# Patient Record
Sex: Male | Born: 1990 | Race: White | Hispanic: No | Marital: Single | Smoking: Never smoker
Health system: Southern US, Community
[De-identification: ages and names within clinical notes are randomized; demographics above are authoritative.]

## PROBLEM LIST (undated history)

## (undated) DIAGNOSIS — F329 Major depressive disorder, single episode, unspecified: Secondary | ICD-10-CM

## (undated) DIAGNOSIS — N200 Calculus of kidney: Secondary | ICD-10-CM

## (undated) DIAGNOSIS — B019 Varicella without complication: Secondary | ICD-10-CM

## (undated) DIAGNOSIS — F32A Depression, unspecified: Secondary | ICD-10-CM

## (undated) HISTORY — DX: Major depressive disorder, single episode, unspecified: F32.9

## (undated) HISTORY — DX: Depression, unspecified: F32.A

## (undated) HISTORY — DX: Varicella without complication: B01.9

---

## 2016-01-21 ENCOUNTER — Encounter (HOSPITAL_COMMUNITY): Payer: Self-pay | Admitting: Emergency Medicine

## 2016-01-21 DIAGNOSIS — N201 Calculus of ureter: Secondary | ICD-10-CM | POA: Insufficient documentation

## 2016-01-21 DIAGNOSIS — R109 Unspecified abdominal pain: Secondary | ICD-10-CM | POA: Diagnosis present

## 2016-01-21 DIAGNOSIS — Z87442 Personal history of urinary calculi: Secondary | ICD-10-CM | POA: Insufficient documentation

## 2016-01-21 LAB — COMPREHENSIVE METABOLIC PANEL
ALBUMIN: 4.1 g/dL (ref 3.5–5.0)
ALT: 47 U/L (ref 17–63)
AST: 28 U/L (ref 15–41)
Alkaline Phosphatase: 57 U/L (ref 38–126)
Anion gap: 11 (ref 5–15)
BILIRUBIN TOTAL: 0.7 mg/dL (ref 0.3–1.2)
BUN: 11 mg/dL (ref 6–20)
CHLORIDE: 105 mmol/L (ref 101–111)
CO2: 26 mmol/L (ref 22–32)
CREATININE: 0.99 mg/dL (ref 0.61–1.24)
Calcium: 9.5 mg/dL (ref 8.9–10.3)
GFR calc Af Amer: >60 mL/min (ref >60)
GLUCOSE: 154 mg/dL — AB (ref 65–99)
POTASSIUM: 3.7 mmol/L (ref 3.5–5.1)
Sodium: 142 mmol/L (ref 135–145)
TOTAL PROTEIN: 7.5 g/dL (ref 6.5–8.1)

## 2016-01-21 LAB — LIPASE, BLOOD: Lipase: 22 U/L (ref 11–51)

## 2016-01-21 LAB — CBC
HEMATOCRIT: 43.5 % (ref 39.0–52.0)
Hemoglobin: 14.7 g/dL (ref 13.0–17.0)
MCH: 28.5 pg (ref 26.0–34.0)
MCHC: 33.8 g/dL (ref 30.0–36.0)
MCV: 84.5 fL (ref 78.0–100.0)
PLATELETS: 294 10*3/uL (ref 150–400)
RBC: 5.15 MIL/uL (ref 4.22–5.81)
RDW: 12.5 % (ref 11.5–15.5)
WBC: 15.5 10*3/uL — AB (ref 4.0–10.5)

## 2016-01-21 MED ORDER — ONDANSETRON 4 MG PO TBDP
4.0000 mg | ORAL_TABLET | Freq: Once | ORAL | Status: AC | PRN
  Administered 2016-01-21: 4 mg via ORAL

## 2016-01-21 MED ORDER — OXYCODONE-ACETAMINOPHEN 5-325 MG PO TABS
ORAL_TABLET | ORAL | Status: AC
  Administered 2016-01-21: 1 via ORAL
  Filled 2016-01-21: qty 1

## 2016-01-21 MED ORDER — ONDANSETRON 4 MG PO TBDP
ORAL_TABLET | ORAL | Status: AC
  Administered 2016-01-21: 4 mg via ORAL
  Filled 2016-01-21: qty 1

## 2016-01-21 MED ORDER — OXYCODONE-ACETAMINOPHEN 5-325 MG PO TABS
1.0000 | ORAL_TABLET | Freq: Once | ORAL | Status: AC
  Administered 2016-01-21: 1 via ORAL

## 2016-01-21 NOTE — ED Notes (Signed)
C/o lower abd pain all day that is worse the last 2 hours.  Also c/o bilateral testicle pain, bilateral flank pain (R worse than L), nausea, and vomiting.  Reports dysuria and dark colored urine.  History of kidney stone.

## 2016-01-22 ENCOUNTER — Emergency Department (HOSPITAL_COMMUNITY): Payer: BLUE CROSS/BLUE SHIELD

## 2016-01-22 ENCOUNTER — Emergency Department (HOSPITAL_COMMUNITY)
Admission: EM | Admit: 2016-01-22 | Discharge: 2016-01-22 | Disposition: A | Discharge status: Home / Self Care | Payer: BLUE CROSS/BLUE SHIELD | Attending: Emergency Medicine | Admitting: Emergency Medicine

## 2016-01-22 ENCOUNTER — Encounter (HOSPITAL_COMMUNITY): Payer: Self-pay | Admitting: Radiology

## 2016-01-22 DIAGNOSIS — R10A0 Flank pain, unspecified side: Secondary | ICD-10-CM

## 2016-01-22 DIAGNOSIS — R109 Unspecified abdominal pain: Secondary | ICD-10-CM

## 2016-01-22 DIAGNOSIS — N201 Calculus of ureter: Secondary | ICD-10-CM

## 2016-01-22 HISTORY — DX: Calculus of kidney: N20.0

## 2016-01-22 LAB — URINE MICROSCOPIC-ADD ON: WBC, UA: NONE SEEN WBC/hpf (ref 0–5)

## 2016-01-22 LAB — URINALYSIS, ROUTINE W REFLEX MICROSCOPIC
GLUCOSE, UA: NEGATIVE mg/dL
KETONES UR: 15 mg/dL — AB
Nitrite: POSITIVE — AB
PH: 5.5 (ref 5.0–8.0)
PROTEIN: 100 mg/dL — AB
Specific Gravity, Urine: 1.03 (ref 1.005–1.030)

## 2016-01-22 MED ORDER — TAMSULOSIN HCL 0.4 MG PO CAPS: 0.4000 mg | ORAL_CAPSULE | Freq: Every day | ORAL | Status: DC

## 2016-01-22 MED ORDER — ONDANSETRON HCL 4 MG/2ML IJ SOLN
4.0000 mg | Freq: Once | INTRAMUSCULAR | Status: AC
  Administered 2016-01-22: 4 mg via INTRAVENOUS
  Filled 2016-01-22: qty 2

## 2016-01-22 MED ORDER — OXYCODONE-ACETAMINOPHEN 5-325 MG PO TABS: 1.0000 | ORAL_TABLET | Freq: Three times a day (TID) | ORAL | Status: DC | PRN

## 2016-01-22 MED ORDER — HYDROMORPHONE HCL 1 MG/ML IJ SOLN
1.0000 mg | Freq: Once | INTRAMUSCULAR | Status: AC
  Administered 2016-01-22: 1 mg via INTRAVENOUS
  Filled 2016-01-22: qty 1

## 2016-01-22 NOTE — ED Provider Notes (Signed)
CSN: [id number]     Arrival date & time 01/21/16  2229 History   First MD Initiated Contact with Patient 01/22/16 0539     Chief Complaint  Patient presents with  . Abdominal Pain  . Flank Pain    Patient is a 25 y.o. male presenting with abdominal pain and flank pain. The history is provided by the patient.  Abdominal Pain Pain location:  R flank Pain quality: sharp   Pain radiates to:  RLQ Pain severity:  Severe Onset quality:  Gradual Timing:  Intermittent Progression:  Resolved Chronicity:  New Relieved by: pain meds. Worsened by:  Nothing tried Associated symptoms: dysuria, nausea and vomiting   Associated symptoms: no chest pain, no cough and no fever   Flank Pain Associated symptoms include abdominal pain. Pertinent negatives include no chest pain.   Pt with history of previous kidney stones presents for onset of right flank pain that radiates into RLQ He reports this feels similar to prior episodes of stones He has had lithotripsy before at an outside hospital  He is now feeling improved  Past Medical History  Diagnosis Date  . Kidney stone    History reviewed. No pertinent past surgical history. No family history on file. Social History  Substance Use Topics  . Smoking status: Never Smoker   . Smokeless tobacco: None  . Alcohol Use: Yes    Review of Systems  Constitutional: Negative for fever.  Respiratory: Negative for cough.   Cardiovascular: Negative for chest pain.  Gastrointestinal: Positive for nausea, vomiting and abdominal pain.  Genitourinary: Positive for dysuria, flank pain and testicular pain.  All other systems reviewed and are negative.     Allergies  Review of patient's allergies indicates no known allergies.  Home Medications   Prior to Admission medications   Medication Sig Start Date End Date Taking? Authorizing Provider  oxyCODONE-acetaminophen (PERCOCET/ROXICET) 5-325 MG tablet Take 1 tablet by mouth every 8 (eight) hours as  needed for severe pain. 01/22/16   Zadie Rhine, MD  tamsulosin (FLOMAX) 0.4 MG CAPS capsule Take 1 capsule (0.4 mg total) by mouth daily after supper. 01/22/16   Zadie Rhine, MD   BP 126/80 mmHg  Pulse 109  Temp(Src) 98.8 F (37.1 C) (Oral)  Resp 16  Ht  (1.702 m)  Wt 88.451 kg  BMI 30.53 kg/m2  SpO2 100% Physical Exam CONSTITUTIONAL: Well developed/well nourished HEAD: Normocephalic/atraumatic EYES: EOMI/ ENMT: Mucous membranes moist NECK: supple no meningeal signs SPINE/BACK:entire spine nontender CV: S1/S2 noted, no murmurs/rubs/gallops noted LUNGS: Lungs are clear to auscultation bilaterally, no apparent distress ABDOMEN: soft, nontender, no rebound or guarding, bowel sounds noted throughout abdomen GU:no cva tenderness NEURO: Pt is awake/alert/appropriate, moves all extremitiesx4.  No facial droop.   EXTREMITIES: pulses normal/equal, full ROM SKIN: warm, color normal PSYCH: no abnormalities of mood noted, alert and oriented to situation  ED Course  Procedures  Medications  ondansetron (ZOFRAN-ODT) disintegrating tablet 4 mg (4 mg Oral Given 01/21/16 2309)  oxyCODONE-acetaminophen (PERCOCET/ROXICET) 5-325 MG per tablet 1 tablet (1 tablet Oral Given 01/21/16 2310)  HYDROmorphone (DILAUDID) injection 1 mg (1 mg Intravenous Given 01/22/16 0520)  ondansetron (ZOFRAN) injection 4 mg (4 mg Intravenous Given 01/22/16 0520)    Labs Review Labs Reviewed  COMPREHENSIVE METABOLIC PANEL - Abnormal; Notable for the following:    Glucose, Bld 154 (*)    All other components within normal limits  CBC - Abnormal; Notable for the following:    WBC 15.5 (*)  All other components within normal limits  URINALYSIS, ROUTINE W REFLEX MICROSCOPIC (NOT AT [facility]) - Abnormal; Notable for the following:    Color, Urine RED (*)    APPearance CLOUDY (*)    Hgb urine dipstick LARGE (*)    Bilirubin Urine SMALL (*)    Ketones, ur 15 (*)    Protein, ur 100 (*)    Nitrite POSITIVE (*)     Leukocytes, UA TRACE (*)    All other components within normal limits  URINE MICROSCOPIC-ADD ON - Abnormal; Notable for the following:    Squamous Epithelial / LPF 0-5 (*)    Bacteria, UA FEW (*)    Crystals CA OXALATE CRYSTALS (*)    All other components within normal limits  URINE CULTURE  LIPASE, BLOOD    Imaging Review Ct Renal Stone Study  01/22/2016  CLINICAL DATA:  Lower abdominal pain and flank pain, worse on the right. Duration 2 hours. EXAM: CT ABDOMEN AND PELVIS WITHOUT CONTRAST TECHNIQUE: Multidetector CT imaging of the abdomen and pelvis was performed following the standard protocol without IV contrast. COMPARISON:  None. FINDINGS: There is an obstructing 4 mm distal right ureteral calculus, 1.5 cm above the ureterovesical junction. There is marked hydronephrosis and hydroureter. There are 2 additional calculi, located in the lower pole right collecting system, measuring 3 mm each. No left urinary calculi. No other acute findings are evident in the abdomen or pelvis. There are unremarkable unenhanced appearances of the liver, gallbladder, bile ducts, pancreas, spleen and adrenals. The abdominal aorta is normal in caliber. There is no atherosclerotic calcification. There is no adenopathy in the abdomen or pelvis. There are normal appearances of the stomach, small bowel and colon. The appendix is normal. There is no significant abnormality in the lower chest. There is no significant musculoskeletal abnormality. IMPRESSION: Obstructing 4 mm distal right ureteral calculus. Right nephrolithiasis. Electronically Signed   By: Ellery Plunkaniel R Mitchell M.D.   On: 01/22/2016 04:49   I have personally reviewed and evaluated these lab results as part of my medical decision-making.   Pt with R ureteral stone He is now pain free He reports good response to flomax in past Will d/c home with flomax/percocet and advised urology f/u We discussed strict return precautions   MDM   Final diagnoses:   Right ureteral stone    Nursing notes including past medical history and social history reviewed and considered in documentation Labs/vital reviewed myself and considered during evaluation     Zadie Rhineonald Zoltan Genest, MD 01/22/16 [phone]

## 2016-01-23 LAB — URINE CULTURE

## 2018-11-17 ENCOUNTER — Ambulatory Visit: Payer: Managed Care, Other (non HMO) | Admitting: Family Medicine

## 2018-11-17 ENCOUNTER — Encounter: Payer: Self-pay | Admitting: Family Medicine

## 2018-11-17 VITALS — BP 122/84 | HR 86 | Temp 98.5°F | Resp 18 | Ht 67.5 in | Wt 224.4 lb

## 2018-11-17 DIAGNOSIS — R110 Nausea: Secondary | ICD-10-CM | POA: Diagnosis not present

## 2018-11-17 DIAGNOSIS — R1030 Lower abdominal pain, unspecified: Secondary | ICD-10-CM

## 2018-11-17 DIAGNOSIS — R142 Eructation: Secondary | ICD-10-CM | POA: Diagnosis not present

## 2018-11-17 DIAGNOSIS — Z113 Encounter for screening for infections with a predominantly sexual mode of transmission: Secondary | ICD-10-CM

## 2018-11-17 LAB — CBC WITH DIFFERENTIAL/PLATELET
BASOS ABS: 0.1 10*3/uL (ref 0.0–0.1)
BASOS PCT: 1 % (ref 0.0–3.0)
EOS ABS: 0.3 10*3/uL (ref 0.0–0.7)
Eosinophils Relative: 2.5 % (ref 0.0–5.0)
HCT: 44.1 % (ref 39.0–52.0)
HEMOGLOBIN: 15.1 g/dL (ref 13.0–17.0)
Lymphocytes Relative: 34 % (ref 12.0–46.0)
Lymphs Abs: 3.5 10*3/uL (ref 0.7–4.0)
MCHC: 34.2 g/dL (ref 30.0–36.0)
MCV: 84.6 fl (ref 78.0–100.0)
MONO ABS: 0.9 10*3/uL (ref 0.1–1.0)
Monocytes Relative: 8.8 % (ref 3.0–12.0)
Neutro Abs: 5.5 10*3/uL (ref 1.4–7.7)
Neutrophils Relative %: 53.7 % (ref 43.0–77.0)
PLATELETS: 348 10*3/uL (ref 150.0–400.0)
RBC: 5.21 Mil/uL (ref 4.22–5.81)
RDW: 13.1 % (ref 11.5–15.5)
WBC: 10.3 10*3/uL (ref 4.0–10.5)

## 2018-11-17 LAB — COMPREHENSIVE METABOLIC PANEL
ALBUMIN: 4.7 g/dL (ref 3.5–5.2)
ALT: 61 U/L — AB (ref 0–53)
AST: 24 U/L (ref 0–37)
Alkaline Phosphatase: 62 U/L (ref 39–117)
BUN: 12 mg/dL (ref 6–23)
CHLORIDE: 102 meq/L (ref 96–112)
CO2: 26 meq/L (ref 19–32)
CREATININE: 0.85 mg/dL (ref 0.40–1.50)
Calcium: 10 mg/dL (ref 8.4–10.5)
GFR: 115.64 mL/min (ref >60.00)
Glucose, Bld: 118 mg/dL — ABNORMAL HIGH (ref 70–99)
Potassium: 3.8 mEq/L (ref 3.5–5.1)
SODIUM: 137 meq/L (ref 135–145)
Total Bilirubin: 0.4 mg/dL (ref 0.2–1.2)
Total Protein: 7.8 g/dL (ref 6.0–8.3)

## 2018-11-17 LAB — URINALYSIS, ROUTINE W REFLEX MICROSCOPIC
BILIRUBIN URINE: NEGATIVE
HGB URINE DIPSTICK: NEGATIVE
KETONES UR: NEGATIVE
LEUKOCYTES UA: NEGATIVE
NITRITE: NEGATIVE
RBC / HPF: NONE SEEN (ref 0–?)
Specific Gravity, Urine: 1.025 (ref 1.000–1.030)
Total Protein, Urine: NEGATIVE
Urine Glucose: NEGATIVE
Urobilinogen, UA: 1 (ref 0.0–1.0)
pH: 6 (ref 5.0–8.0)

## 2018-11-17 LAB — AMYLASE: Amylase: 28 U/L (ref 27–131)

## 2018-11-17 LAB — LIPASE: Lipase: 17 U/L (ref 11.0–59.0)

## 2018-11-17 MED ORDER — OMEPRAZOLE 20 MG PO CPDR: 20.0000 mg | DELAYED_RELEASE_CAPSULE | Freq: Every day | ORAL | 3 refills | Status: AC

## 2018-11-17 NOTE — Progress Notes (Signed)
Subjective:    Patient ID: Steve Ellison, male    DOB: 03/04/1991, 28 y.o.   MRN: [id number]  HPI   Patient presents to clinic to establish with PCP.  Currently he takes no medications, and reports a history of kidney stone about 2 years ago.  Today he complains of right lower and left lower quadrant abdominal pain that is been coming and going for the past 3 days.  Also states over the past 2 weeks he has noticed waves of nausea with increased burping, and some loose stools.  Patient denies any changes in diet, states his appetite is normal.  Denies fever or chills.  States the current pain in his lower abdomen does not feel similar to when he had kidney stone.  Patient is also interested in STD screening today.   Past Medical History:  Diagnosis Date  . Kidney stone    Social History   Tobacco Use  . Smoking status: Never Smoker  . Smokeless tobacco: Never Used  Substance Use Topics  . Alcohol use: Yes   History reviewed. No pertinent surgical history.  Family History  Problem Relation Age of Onset  . Depression Mother   . Hypertension Mother   . Cancer Father   . Cancer Paternal Grandmother   . Cancer Paternal Grandfather   . Depression Brother   . Drug abuse Brother      Review of Systems  Constitutional: Negative for chills, fatigue and fever.  HENT: Negative for congestion, ear pain, sinus pain and sore throat.   Eyes: Negative.   Respiratory: Negative for cough, shortness of breath and wheezing.   Cardiovascular: Negative for chest pain, palpitations and leg swelling.  Gastrointestinal: Lower ABD pain, some nausea/belching and loose stools. Genitourinary: Negative for dysuria, frequency and urgency.  Musculoskeletal: Negative for arthralgias and myalgias.  Skin: Negative for color change, pallor and rash.  Neurological: Negative for syncope, light-headedness and headaches.  Psychiatric/Behavioral: The patient is not nervous/anxious.       Objective:   Physical Exam  Constitutional: He appears well-developed and well-nourished. No distress.  HENT:  Head: Normocephalic and atraumatic.  Eyes: Pupils are equal, round, and reactive to light. Conjunctivae and EOM are normal. No scleral icterus.  Neck: Normal range of motion. Neck supple. No tracheal deviation present.  Cardiovascular: Normal rate, regular rhythm and normal heart sounds.  Pulmonary/Chest: Effort normal and breath sounds normal. No respiratory distress. He has no wheezes. He has no rales.  Abdominal: Soft. Bowel sounds are normal. No guarding, no rebound, no mass. Mild RLQ and LLQ tenderness with palpation.  Neurological: He is alert and oriented to person, place, and time.  Gait normal  Skin: Skin is warm and dry. He is not diaphoretic. No pallor.  Psychiatric: He has a normal mood and affect. His behavior is normal.    Nursing note and vitals reviewed.   Vitals:   11/17/18 0900  BP: 122/84  Pulse: 86  Resp: 18  Temp: 98.5 F (36.9 C)  SpO2: 97%      Assessment & Plan:   Lower abdominal pain, nausea, belching-unclear reason for abdominal pain.  We will get lab work today including CBC, CMP, amylase and lipase.  Suspect the nausea and belching could be related to acid reflux.  He will trial taking omeprazole 20 mg once per day.  We will also get CT scan of abdomen and pelvis to further investigate cause of lower abdominal pain.  STD screen-HIV and RPR checked in  lab work.  Urine sample collected to test for gonorrhea, chlamydia and trichomonas.  We will base need for next follow-up off of lab results.  Patient aware someone will contact him in regard to CT scan appointment.  Advised that if pain becomes severe, he develops vomiting and diarrhea, develops fever, he should call office and or go to emergency room right away for evaluation.

## 2018-11-18 ENCOUNTER — Telehealth: Payer: Self-pay

## 2018-11-18 LAB — RPR: RPR: NONREACTIVE

## 2018-11-18 LAB — HIV ANTIBODY (ROUTINE TESTING W REFLEX): HIV: NONREACTIVE

## 2018-11-18 LAB — URINE CYTOLOGY ANCILLARY ONLY
Chlamydia: NEGATIVE
Neisseria Gonorrhea: NEGATIVE
TRICH (WINDOWPATH): NEGATIVE

## 2018-11-18 NOTE — Telephone Encounter (Signed)
Kidney stone is possible, but urine did not show any blood which usually happens  Kidney stone would have to be confirmed with CT scan or Renal US  I cannot say for sure exactly what is causing pain without imaging

## 2018-11-18 NOTE — Telephone Encounter (Signed)
Called Pt and he said he  spoke with [facility] and she is trying to get him in for a CT scan or [place] asap

## 2018-11-18 NOTE — Telephone Encounter (Signed)
Copied from CRM [phone]. Topic: General - Other >> Nov 18, 2018  2:24 PM Percival SpanishKennedy, Cheryl W wrote:  Pt called and wanted to let Lauren know that he think he may have a kidney stone cause the pain has moved to his back and he does not think he will be doing the CT Scan

## 2018-11-19 ENCOUNTER — Ambulatory Visit (HOSPITAL_COMMUNITY): Payer: Managed Care, Other (non HMO)

## 2018-11-23 ENCOUNTER — Telehealth: Payer: Self-pay

## 2018-11-23 DIAGNOSIS — R1030 Lower abdominal pain, unspecified: Secondary | ICD-10-CM

## 2018-11-23 NOTE — Telephone Encounter (Signed)
Copied from [place] [id number]. Topic: Conservator, museum/gallery Patient (Clinic Use ONLY) >> Nov 23, 2018 10:14 AM Neta Ehlers, RMA wrote: Reason for CRM: nformation about cost of CT scan based on his insurance from [place]:  It is based on his deductible. Once he meets his deductible he will be responsible for 30%. I can send his order to [facility], which is less out of pocket than most places, but he will still have to pay more till his deductible is met. >> Nov 23, 2018 10:40 AM Bea Graff, NT wrote: Pt advised of message below. Pt would like to see if he can get info of how much it would be at [facility]. Please advise.

## 2018-12-04 NOTE — Telephone Encounter (Signed)
Patient called back in to check status of ultrasound / he states he hasnt heard anything back and would like a update

## 2018-12-07 ENCOUNTER — Telehealth: Payer: Self-pay | Admitting: Family Medicine

## 2018-12-07 NOTE — Telephone Encounter (Addendum)
Pt states he was to hear about scheudling an [place]. I do not see an order. Pt needs new referral for ultra sound  Lauren will need to put in new order

## 2018-12-07 NOTE — Addendum Note (Signed)
Addended by: Leanora Cover on: 12/07/2018 03:55 PM   Modules accepted: Orders

## 2018-12-07 NOTE — Telephone Encounter (Signed)
Steve Ellison, Can you place the order for his ultrasound? Thanks!

## 2018-12-07 NOTE — Telephone Encounter (Signed)
Was patient to have a CT or [place]? If [place] place ordr please.

## 2018-12-11 ENCOUNTER — Ambulatory Visit (HOSPITAL_COMMUNITY)
Admission: RE | Admit: 2018-12-11 | Discharge: 2018-12-11 | Disposition: A | Discharge status: Home / Self Care | Payer: Managed Care, Other (non HMO) | Source: Ambulatory Visit | Attending: Family Medicine | Admitting: Family Medicine

## 2018-12-11 DIAGNOSIS — R1030 Lower abdominal pain, unspecified: Secondary | ICD-10-CM

## 2018-12-14 ENCOUNTER — Other Ambulatory Visit: Payer: Self-pay | Admitting: Family Medicine

## 2018-12-14 DIAGNOSIS — N200 Calculus of kidney: Secondary | ICD-10-CM

## 2018-12-17 ENCOUNTER — Ambulatory Visit
Admission: RE | Admit: 2018-12-17 | Discharge: 2018-12-17 | Disposition: A | Discharge status: Home / Self Care | Payer: Managed Care, Other (non HMO) | Source: Ambulatory Visit | Attending: Urology | Admitting: Urology

## 2018-12-17 ENCOUNTER — Encounter: Payer: Self-pay | Admitting: Urology

## 2018-12-17 ENCOUNTER — Ambulatory Visit: Payer: Managed Care, Other (non HMO) | Admitting: Urology

## 2018-12-17 VITALS — BP 131/81 | HR 97 | Ht 67.5 in | Wt 225.7 lb

## 2018-12-17 DIAGNOSIS — N200 Calculus of kidney: Secondary | ICD-10-CM

## 2018-12-17 DIAGNOSIS — R1031 Right lower quadrant pain: Secondary | ICD-10-CM

## 2018-12-17 LAB — URINALYSIS, COMPLETE
Bilirubin, UA: NEGATIVE
Glucose, UA: NEGATIVE
Ketones, UA: NEGATIVE
LEUKOCYTES UA: NEGATIVE
Nitrite, UA: NEGATIVE
PH UA: 5.5 (ref 5.0–7.5)
PROTEIN UA: NEGATIVE
RBC, UA: NEGATIVE
Specific Gravity, UA: >1.03 — ABNORMAL HIGH (ref 1.005–1.030)
Urobilinogen, Ur: 0.2 mg/dL (ref 0.2–1.0)

## 2018-12-17 NOTE — Progress Notes (Signed)
12/17/2018 9:05 AM   Astrid Drafts 07-Jan-1991 [id number]  Referring provider: Tracey Harries, FNP [address] [place], [place] [address]  CC: Right sided flank and lower abdominal pain  HPI: I was asked to see Mr. Steve Ellison in urology clinic in consultation for right-sided flank and right-sided groin pain from Leanora Cover, NP.  He is a healthy 28 year old male with a past medical history of nephrolithiasis status post shockwave lithotripsy in 2015 who presents with 3 to 4 weeks of intermittent right-sided flank and groin pain, and nausea.  He states it feels similar to his prior kidney stones, but not quite as severe.  He had a renal ultrasound performed on 12/11/2018 that showed no hydronephrosis, and a non-obstructing 8 mm right lower pole stone.  He denies any fevers or chills.  Urinalysis shows no evidence of infection.  There are no aggravating or alleviating factors.  Severity is mild to moderate.   PMH: Past Medical History:  Diagnosis Date  . Chicken pox   . Depression   . Kidney stone     Surgical History: SWL  Allergies: No Known Allergies  Family History: Family History  Problem Relation Age of Onset  . Depression Mother   . Hypertension Mother   . Cancer Father   . Cancer Paternal Grandmother   . Cancer Paternal Grandfather   . Depression Brother   . Drug abuse Brother     Social History:  reports that he has never smoked. He has never used smokeless tobacco. He reports current alcohol use. He reports that he does not use drugs.  ROS: Please see flowsheet from today's date for complete review of systems.  Physical Exam: BP 131/81 (BP Location: Left Arm, Patient Position: Sitting, Cuff Size: Large)   Pulse 97   Ht 5' 7.5" (1.715 m)   Wt 225 lb 11.2 oz (102.4 kg)   BMI 34.83 kg/m    Constitutional:  Alert and oriented, No acute distress. Cardiovascular: No clubbing, cyanosis, or edema. Respiratory: Normal respiratory effort, no increased  work of breathing. GI: Abdomen is soft, nontender, nondistended, no abdominal masses GU: Mild right CVA tenderness Lymph: No cervical or inguinal lymphadenopathy. Skin: No rashes, bruises or suspicious lesions. Neurologic: Grossly intact, no focal deficits, moving all 4 extremities. Psychiatric: Normal mood and affect.  Laboratory Data: Urinalysis today 0 RBCs, 0 WBCs, no bacteria, nitrite negative  Pertinent Imaging: I have personally reviewed the renal ultrasound dated 12/11/2018.  There is no hydronephrosis.  There is an 8 mm right lower pole nonobstructing stone.  Assessment & Plan:   In summary, the patient is a healthy 28 year old male with a history of nephrolithiasis, intermittent right-sided flank and groin pain, and an ultrasound showing no hydronephrosis, but a nonobstructing right 8 mm lower pole stone.  We discussed various treatment options for urolithiasis including observation with or without medical expulsive therapy, shockwave lithotripsy (SWL), ureteroscopy and laser lithotripsy with stent placement, and percutaneous nephrolithotomy.  We discussed that management is based on stone size, location, density, patient co-morbidities, and patient preference.   Stones <53mm in size have a >80% spontaneous passage rate. Data surrounding the use of tamsulosin for medical expulsive therapy is controversial, but meta analyses suggests it is most efficacious for distal stones between 5-8mm in size. Possible side effects include dizziness/lightheadedness, and retrograde ejaculation.  SWL has a lower stone free rate in a single procedure, but also a lower complication rate compared to ureteroscopy and avoids a stent and associated  stent related symptoms. Possible complications include renal hematoma, steinstrasse, and need for additional treatment.  Ureteroscopy with laser lithotripsy and stent placement has a higher stone free rate than SWL in a single procedure, however increased  complication rate including possible infection, ureteral injury, bleeding, and stent related morbidity. Common stent related symptoms include dysuria, urgency/frequency, and flank pain.  PCNL is the favored treatment for stones >2cm. It involves a small incision in the flank, with complete fragmentation of stones and removal. It has the highest stone free rate, but also the highest complication rate. Possible complications include bleeding, infection/sepsis, injury to surrounding organs including the pleura, and collecting system injury.   I discussed at length with the patient the need for further cross-sectional imaging prior to determining any treatment strategies.  I recommended CT stone protocol as the gold standard imaging in terms of identifying stone location, size, density, and skin to stone distance.  The patient would like to proceed with shockwave lithotripsy if stone amenable, will call with CT results   Sondra Come, MD  [facility] [address] [place], [place] [address] [phone]

## 2018-12-17 NOTE — Patient Instructions (Signed)
Lithotripsy  Lithotripsy is a treatment that can sometimes help eliminate kidney stones and the pain that they cause. A form of lithotripsy, also known as extracorporeal shock wave lithotripsy, is a nonsurgical procedure that crushes a kidney stone with shock waves. These shock waves pass through your body and focus on the kidney stone. They cause the kidney stone to break up while it is still in the urinary tract. This makes it easier for the smaller pieces of stone to pass in the urine. Tell a health care provider about:  Any allergies you have.  All medicines you are taking, including vitamins, herbs, eye drops, creams, and over-the-counter medicines.  Any blood disorders you have.  Any surgeries you have had.  Any medical conditions you have.  Whether you are pregnant or may be pregnant.  Any problems you or family members have had with anesthetic medicines. What are the risks? Generally, this is a safe procedure. However, problems may occur, including:  Infection.  Bleeding of the kidney.  Bruising of the kidney or skin.  Scarring of the kidney, which can lead to: ? Increased blood pressure. ? Poor kidney function. ? Return (recurrence) of kidney stones.  Damage to other structures or organs, such as the liver, colon, spleen, or pancreas.  Blockage (obstruction) of the the tube that carries urine from the kidney to the bladder (ureter).  Failure of the kidney stone to break into pieces (fragments). What happens before the procedure? Staying hydrated Follow instructions from your health care provider about hydration, which may include:  Up to 2 hours before the procedure - you may continue to drink clear liquids, such as water, clear fruit juice, black coffee, and plain tea. Eating and drinking restrictions Follow instructions from your health care provider about eating and drinking, which may include:  8 hours before the procedure - stop eating heavy meals or foods  such as meat, fried foods, or fatty foods.  6 hours before the procedure - stop eating light meals or foods, such as toast or cereal.  6 hours before the procedure - stop drinking milk or drinks that contain milk.  2 hours before the procedure - stop drinking clear liquids. General instructions  Plan to have someone take you home from the hospital or clinic.  Ask your health care provider about: ? Changing or stopping your regular medicines. This is especially important if you are taking diabetes medicines or blood thinners. ? Taking medicines such as aspirin and ibuprofen. These medicines and other NSAIDs can thin your blood. Do not take these medicines for 7 days before your procedure if your health care provider instructs you not to.  You may have tests, such as: ? Blood tests. ? Urine tests. ? Imaging tests, such as a CT scan. What happens during the procedure?  To lower your risk of infection: ? Your health care team will wash or sanitize their hands. ? Your skin will be washed with soap.  An IV tube will be inserted into one of your veins. This tube will give you fluids and medicines.  You will be given one or more of the following: ? A medicine to help you relax (sedative). ? A medicine to make you fall asleep (general anesthetic).  A water-filled cushion may be placed behind your kidney or on your abdomen. In some cases you may be placed in a tub of lukewarm water.  Your body will be positioned in a way that makes it easy to target the kidney   stone.  A flexible tube with holes in it (stent) may be placed in the ureter. This will help keep urine flowing from the kidney if the fragments of the stone have been blocking the ureter.  An X-ray or ultrasound exam will be done to locate your stone.  Shock waves will be aimed at the stone. If you are awake, you may feel a tapping sensation as the shock waves pass through your body. The procedure may vary among health care  providers and hospitals. What happens after the procedure?  You may have an X-ray to see whether the procedure was able to break up the kidney stone and how much of the stone has passed. If large stone fragments remain after treatment, you may need to have a second procedure at a later time.  Your blood pressure, heart rate, breathing rate, and blood oxygen level will be monitored until the medicines you were given have worn off.  You may be given antibiotics or pain medicine as needed.  If a stent was placed in your ureter during surgery, it may stay in place for a few weeks.  You may need strain your urine to collect pieces of the kidney stone for testing.  You will need to drink plenty of water.  Do not drive for 24 hours if you were given a sedative. Summary  Lithotripsy is a treatment that can sometimes help eliminate kidney stones and the pain that they cause.  A form of lithotripsy, also known as extracorporeal shock wave lithotripsy, is a nonsurgical procedure that crushes a kidney stone with shock waves.  Generally, this is a safe procedure. However, problems may occur, including damage to the kidney or other organs, infection, or obstruction of the tube that carries urine from the kidney to the bladder (ureter).  When you go home, you will need to drink plenty of water. You may be asked to strain your urine to collect pieces of the kidney stone for testing. This information is not intended to replace advice given to you by your health care provider. Make sure you discuss any questions you have with your health care provider. Document Released: 10/18/2000 Document Revised: 11/06/2017 Document Reviewed: 09/11/2016 Elsevier Interactive Patient Education  2019 [employer]  

## 2018-12-22 ENCOUNTER — Other Ambulatory Visit: Payer: Self-pay | Admitting: Radiology

## 2018-12-22 ENCOUNTER — Telehealth: Payer: Self-pay | Admitting: Radiology

## 2018-12-22 ENCOUNTER — Other Ambulatory Visit: Payer: Managed Care, Other (non HMO)

## 2018-12-22 DIAGNOSIS — N200 Calculus of kidney: Secondary | ICD-10-CM

## 2018-12-22 NOTE — Telephone Encounter (Signed)
Notified patient of extracorporeal shockwave lithotripsy scheduled on 12/31/2018 at 6:15. Patient should arrive at that time to Same Day Surgery. Pre-op & discharge instructions for lithotripsy were provided to patient.    Advised patient to hold NSAIDS & aspirin products for 3 days prior to the procedure beginning on 12/28/2018.  Patient's questions were answered & he expresses understanding of these instructions.    Nicolasa Ducking, RN

## 2018-12-24 LAB — CULTURE, URINE COMPREHENSIVE

## 2018-12-31 ENCOUNTER — Other Ambulatory Visit: Payer: Self-pay

## 2018-12-31 ENCOUNTER — Ambulatory Visit
Admission: RE | Admit: 2018-12-31 | Discharge: 2018-12-31 | Disposition: A | Discharge status: Home / Self Care | Payer: Managed Care, Other (non HMO) | Attending: Urology | Admitting: Urology

## 2018-12-31 ENCOUNTER — Encounter: Payer: Self-pay | Admitting: *Deleted

## 2018-12-31 ENCOUNTER — Encounter
Admission: RE | Disposition: A | Discharge status: Home / Self Care | Payer: Self-pay | Source: Home / Self Care | Attending: Urology

## 2018-12-31 ENCOUNTER — Ambulatory Visit: Payer: Managed Care, Other (non HMO)

## 2018-12-31 DIAGNOSIS — N200 Calculus of kidney: Secondary | ICD-10-CM | POA: Insufficient documentation

## 2018-12-31 HISTORY — PX: EXTRACORPOREAL SHOCK WAVE LITHOTRIPSY: SHX1557

## 2018-12-31 SURGERY — LITHOTRIPSY, ESWL
Anesthesia: Moderate Sedation | Laterality: Right

## 2018-12-31 MED ORDER — DIAZEPAM 5 MG PO TABS
ORAL_TABLET | ORAL | Status: AC
  Administered 2018-12-31: 10 mg via ORAL
  Filled 2018-12-31: qty 2

## 2018-12-31 MED ORDER — ONDANSETRON HCL 4 MG/2ML IJ SOLN
INTRAMUSCULAR | Status: AC
  Administered 2018-12-31: 4 mg via INTRAVENOUS
  Filled 2018-12-31: qty 2

## 2018-12-31 MED ORDER — CIPROFLOXACIN HCL 500 MG PO TABS
ORAL_TABLET | ORAL | Status: AC
  Administered 2018-12-31: 500 mg via ORAL
  Filled 2018-12-31: qty 1

## 2018-12-31 MED ORDER — SODIUM CHLORIDE (PF) 0.9 % IJ SOLN
INTRAMUSCULAR | Status: AC
  Administered 2018-12-31: 10 mL
  Filled 2018-12-31: qty 10

## 2018-12-31 MED ORDER — DIAZEPAM 5 MG PO TABS
10.0000 mg | ORAL_TABLET | ORAL | Status: AC
  Administered 2018-12-31: 10 mg via ORAL

## 2018-12-31 MED ORDER — SODIUM CHLORIDE 0.9 % IV SOLN
INTRAVENOUS | Status: DC
  Administered 2018-12-31: 07:00:00 via INTRAVENOUS

## 2018-12-31 MED ORDER — TAMSULOSIN HCL 0.4 MG PO CAPS: 0.4000 mg | ORAL_CAPSULE | Freq: Every day | ORAL | 0 refills | Status: AC

## 2018-12-31 MED ORDER — DIPHENHYDRAMINE HCL 25 MG PO CAPS
25.0000 mg | ORAL_CAPSULE | ORAL | Status: AC
  Administered 2018-12-31: 25 mg via ORAL

## 2018-12-31 MED ORDER — HYDROCODONE-ACETAMINOPHEN 5-325 MG PO TABS: 1.0000 | ORAL_TABLET | ORAL | 0 refills | Status: AC | PRN

## 2018-12-31 MED ORDER — DIPHENHYDRAMINE HCL 25 MG PO CAPS
ORAL_CAPSULE | ORAL | Status: AC
  Administered 2018-12-31: 25 mg via ORAL
  Filled 2018-12-31: qty 1

## 2018-12-31 MED ORDER — ONDANSETRON HCL 4 MG/2ML IJ SOLN
4.0000 mg | Freq: Once | INTRAMUSCULAR | Status: AC
  Administered 2018-12-31 (×2): 4 mg via INTRAVENOUS

## 2018-12-31 MED ORDER — ONDANSETRON HCL 2 MG/ML IV SOLN: 4.0000 mg | Freq: Once | INTRAVENOUS | Status: DC

## 2018-12-31 MED ORDER — CIPROFLOXACIN HCL 500 MG PO TABS
500.0000 mg | ORAL_TABLET | ORAL | Status: AC
  Administered 2018-12-31: 500 mg via ORAL

## 2018-12-31 NOTE — OR Nursing (Signed)
Discharge instructions discussed with pt and Dianna. Both voice understanding.

## 2018-12-31 NOTE — Discharge Instructions (Signed)

## 2018-12-31 NOTE — H&P (Signed)
UROLOGY H&P UPDATE  Agree with prior H&P dated 12/17/2018  Cardiac: RRR Lungs: CTA bilaterally  Laterality: RIGHT Procedure: RIGHT SWL  Right symptomatic 45mm renal stone, 695HU, 12cm SSD  Urine: 2/18: 8K lactobacillus contaminant, asymptomatic   Informed consent obtained, we specifically discussed the risks of bleeding, infection, post-operative pain, need for additional procedures, steinstrasse.  Sondra Come, MD 12/31/2018

## 2019-01-13 ENCOUNTER — Other Ambulatory Visit: Payer: Self-pay | Admitting: *Deleted

## 2019-01-13 DIAGNOSIS — N200 Calculus of kidney: Secondary | ICD-10-CM

## 2019-01-14 ENCOUNTER — Ambulatory Visit
Admission: RE | Admit: 2019-01-14 | Discharge: 2019-01-14 | Disposition: A | Discharge status: Home / Self Care | Payer: Managed Care, Other (non HMO) | Source: Ambulatory Visit | Attending: Urology | Admitting: Urology

## 2019-01-14 ENCOUNTER — Ambulatory Visit (INDEPENDENT_AMBULATORY_CARE_PROVIDER_SITE_OTHER): Payer: Managed Care, Other (non HMO) | Admitting: Urology

## 2019-01-14 ENCOUNTER — Ambulatory Visit
Admission: RE | Admit: 2019-01-14 | Discharge: 2019-01-14 | Disposition: A | Discharge status: Home / Self Care | Payer: Managed Care, Other (non HMO) | Attending: Urology | Admitting: Urology

## 2019-01-14 ENCOUNTER — Other Ambulatory Visit: Payer: Self-pay

## 2019-01-14 ENCOUNTER — Encounter: Payer: Self-pay | Admitting: Urology

## 2019-01-14 VITALS — BP 144/90 | HR 96 | Ht 67.5 in | Wt 225.0 lb

## 2019-01-14 DIAGNOSIS — N200 Calculus of kidney: Secondary | ICD-10-CM | POA: Insufficient documentation

## 2019-01-14 NOTE — Progress Notes (Signed)
   01/14/2019 10:23 AM   Astrid Drafts 1991/05/25 [id number]  Reason for visit: Follow up s/p R SWL  HPI: I saw Mr. Flasher back in urology clinic today for follow-up after right shockwave lithotripsy on 12/31/2018.  He underwent uncomplicated right S wall for a symptomatic non-obstructing 6 mm right renal stone, 695HU, 12 cm skin to stone distance.  He has been doing well since his procedure and reports no significant symptoms.  He has passed some small fragments and dust.  He notes mild right-sided abdominal discomfort, but he also reports he feels very constipated lately.  I personally reviewed his KUB, and there are no visible stone fragments.  There is moderate to severe constipation.  We discussed general stone prevention strategies including adequate hydration with goal of producing 2.5 L of urine daily, increasing citric acid intake, increasing calcium intake during high oxalate meals, minimizing animal protein, and decreasing salt intake. Information about dietary recommendations given today.  Litholyte samples and order information provided.  RTC 1 year with KUB  A total of 10 minutes were spent face-to-face with the patient, greater than 50% was spent in patient education, counseling, and coordination of care regarding KUB results and stone prevention strategies.   Sondra Come, MD  [facility] [address], [address] [place], [place] [address] [phone]

## 2019-01-18 ENCOUNTER — Telehealth: Payer: Self-pay

## 2019-01-18 NOTE — Telephone Encounter (Signed)
-----   Message from Sondra Come, MD sent at 01/14/2019 12:10 PM EDT ----- No stones seen on his xray, keep follow up in one year. His abdominal pain now is likely from constipation.  Legrand Rams, MD 01/14/2019

## 2019-01-18 NOTE — Telephone Encounter (Signed)
Called pt informed him of the information below. Pt gave verbal understanding. Pt questions if his stone fragments were sent for analysis, I do not see anything mentioned in last note please advise. Thanks

## 2019-01-19 NOTE — Telephone Encounter (Signed)
His stone fragment was too small and they weren't able to analyze it, if he catches anything larger, ok to bring in and re-send  Steve Rams, MD 01/19/2019

## 2019-01-26 NOTE — Telephone Encounter (Signed)
Called pt, no answer, no voicemail answered. 1st attempt

## 2019-01-26 NOTE — Telephone Encounter (Signed)
Pt calls back, informed him of the information below. Pt gave verbal understanding.

## 2019-12-03 ENCOUNTER — Encounter: Payer: Self-pay | Admitting: Urology

## 2020-01-17 ENCOUNTER — Ambulatory Visit: Payer: Managed Care, Other (non HMO) | Admitting: Urology

## 2020-02-02 ENCOUNTER — Encounter: Payer: Self-pay | Admitting: Urology

## 2020-02-02 ENCOUNTER — Ambulatory Visit: Payer: Managed Care, Other (non HMO) | Admitting: Urology

## 2020-03-06 ENCOUNTER — Telehealth: Payer: Self-pay | Admitting: Family Medicine

## 2020-03-06 NOTE — Telephone Encounter (Signed)
error 

## 2020-08-14 IMAGING — US US ABDOMEN COMPLETE
1 series · 14 of 25 positions shown · non-contrast
Comparison: CT scan of the abdomen dated 01/22/2016

CLINICAL DATA: Right flank pain and right lower quadrant pain.

EXAM:
ABDOMEN ULTRASOUND COMPLETE

[Series 1: us abdomen complete · 14 of 113 slices shown]
[im 1/113]
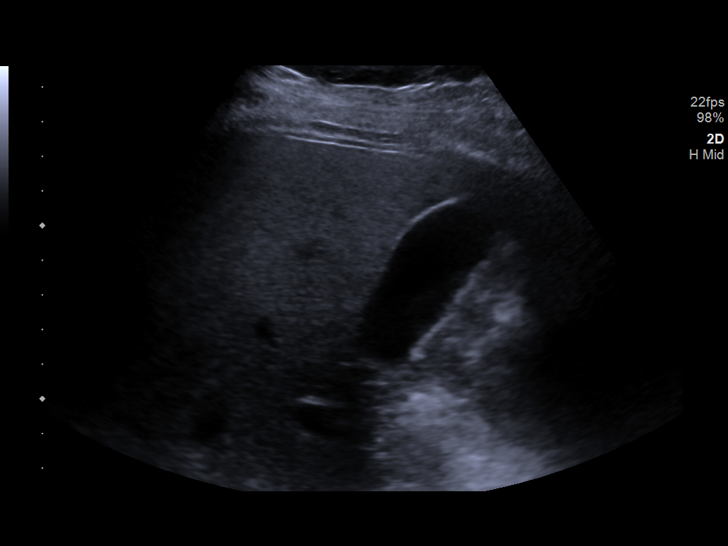
[im 10/113]
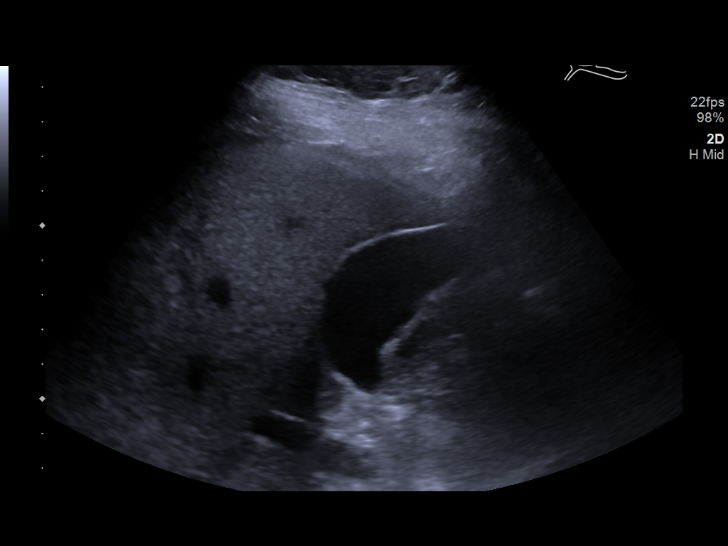
[im 19/113]
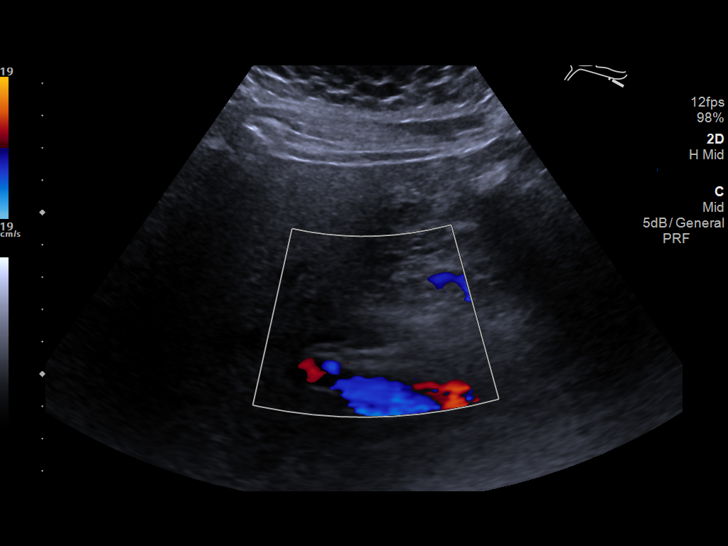
[im 29/113]
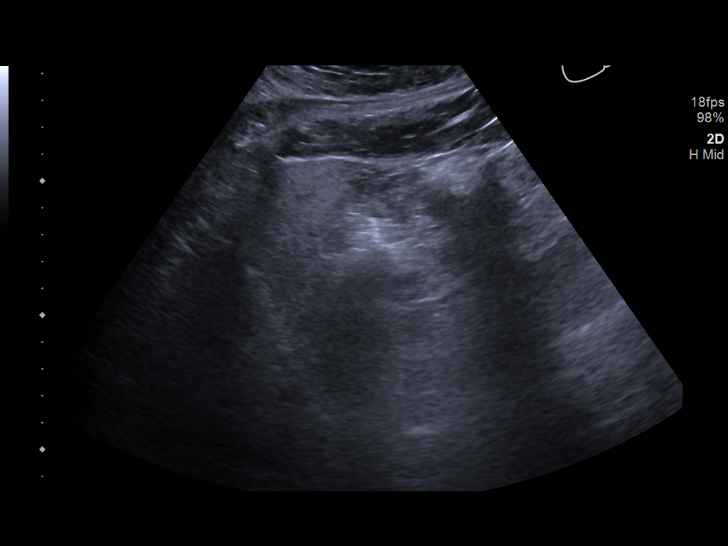
[im 38/113]
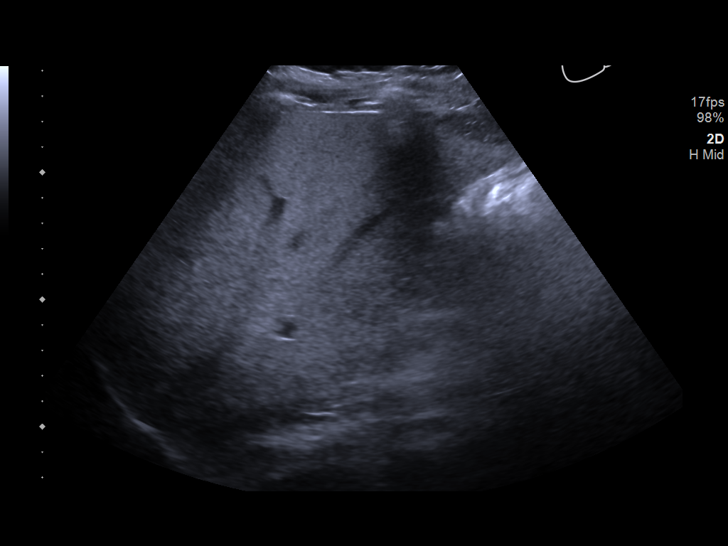
[im 43/113]
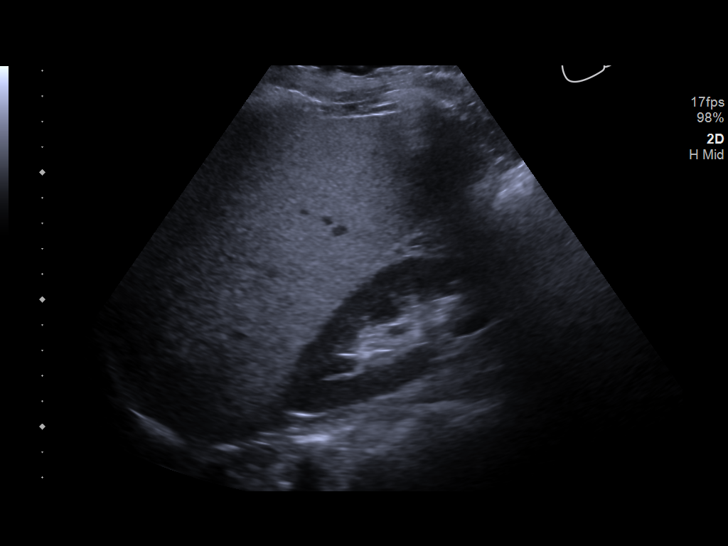
[im 52/113]
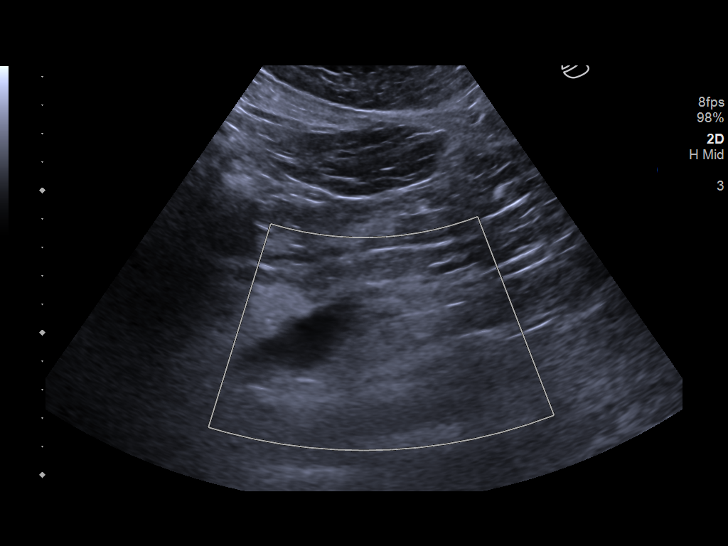
[im 61/113]
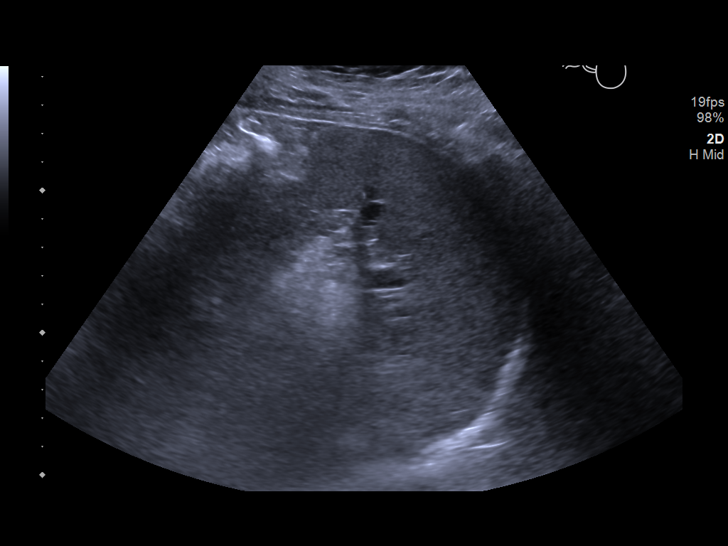
[im 71/113]
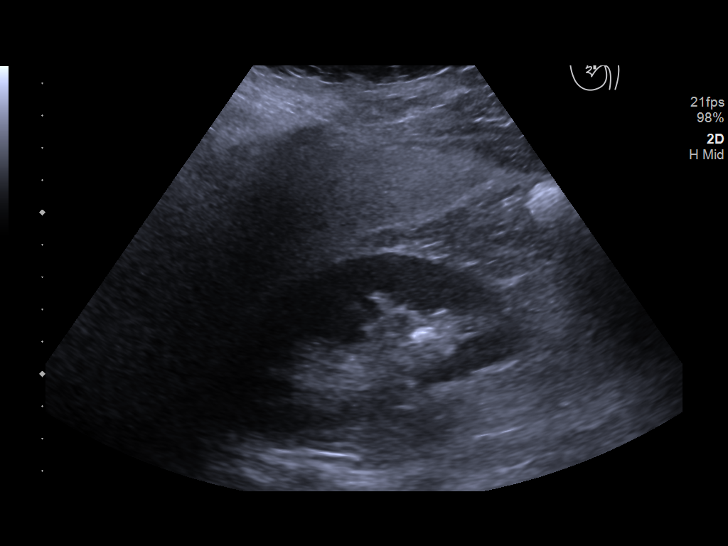
[im 75/113]
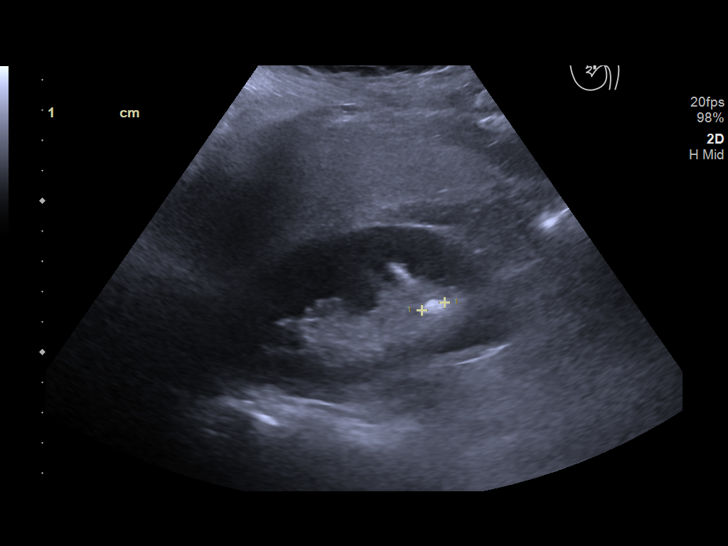
[im 85/113]
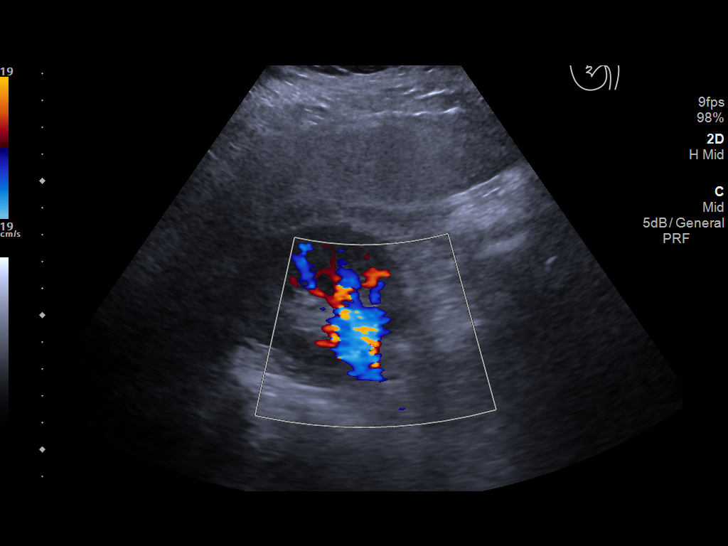
[im 94/113]
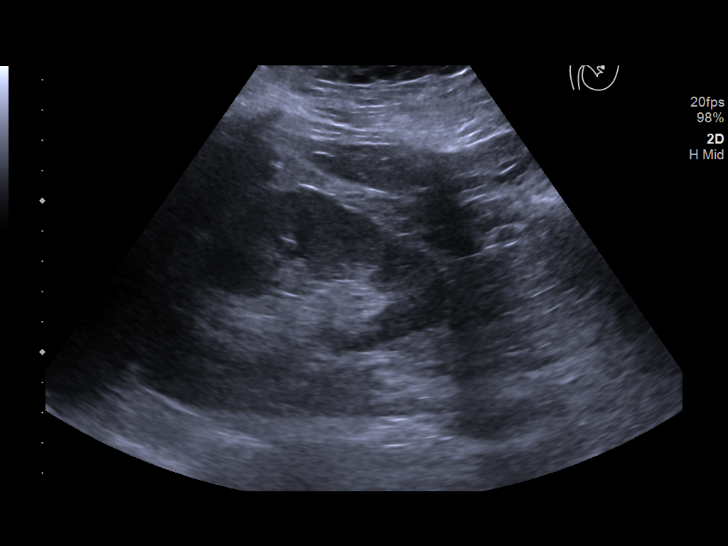
[im 103/113]
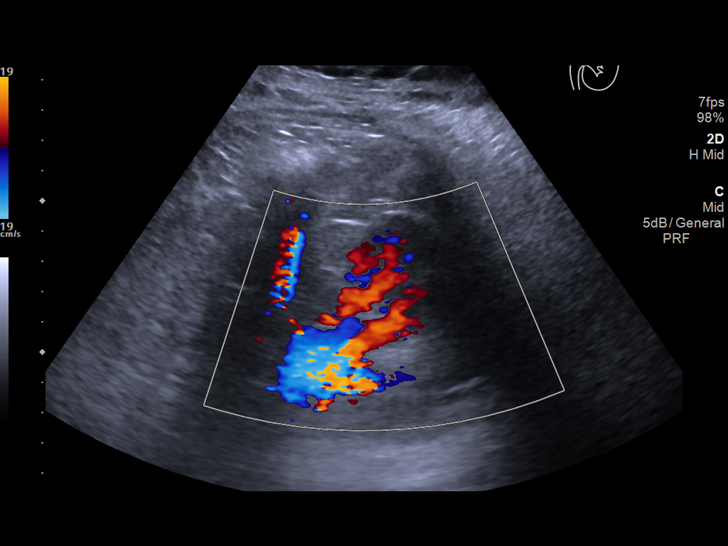
[im 113/113]
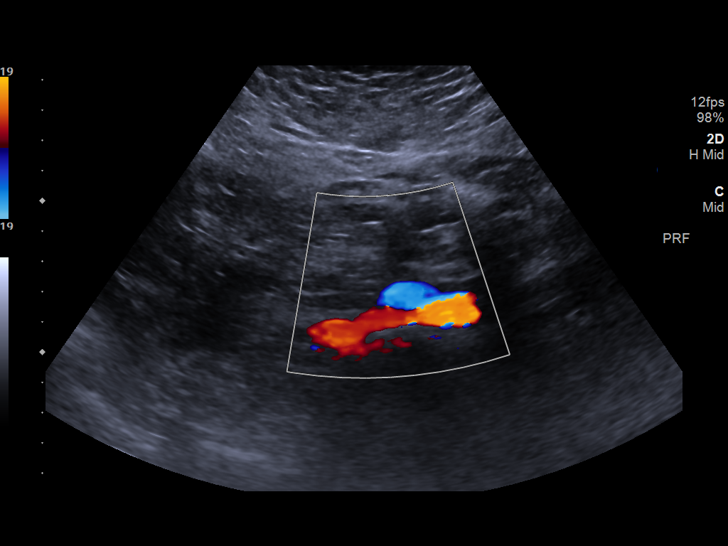

[14 of 25 positions shown; findings below may reference images not displayed]

FINDINGS: Gallbladder: No gallstones or wall thickening visualized. No
sonographic Murphy sign noted by sonographer.

Common bile duct: Diameter: 2.6 mm, normal.

Liver: No focal lesion identified. Within normal limits in
parenchymal echogenicity. Portal vein is patent on color Doppler
imaging with normal direction of blood flow towards the liver.

IVC: No abnormality visualized.

Pancreas: Visualized portion unremarkable.

Spleen: Size and appearance within normal limits.

Right Kidney: Length: 11.0 cm. 8 mm stone in the lower pole.
Echogenicity within normal limits. No mass or hydronephrosis
visualized.

Left Kidney: Length: 11.0 cm. Echogenicity within normal limits. No
mass or hydronephrosis visualized.

Abdominal aorta: No aneurysm visualized.

Other findings: None.
IMPRESSION: No acute abnormalities. 8 mm stone in the lower pole of the right
kidney, increased in size from a 4 mm on the prior CT scan.

## 2020-08-20 IMAGING — CR DG ABDOMEN 1V
1 series · 2 of 2 positions shown · non-contrast
Comparison: Abdominal CT from earlier today

CLINICAL DATA: Nephrolithiasis

EXAM:
ABDOMEN - 1 VIEW

[Series 1: dg abd 1 view · 0.14mm/px · 2 of 2 slices shown]
[im 1/2]
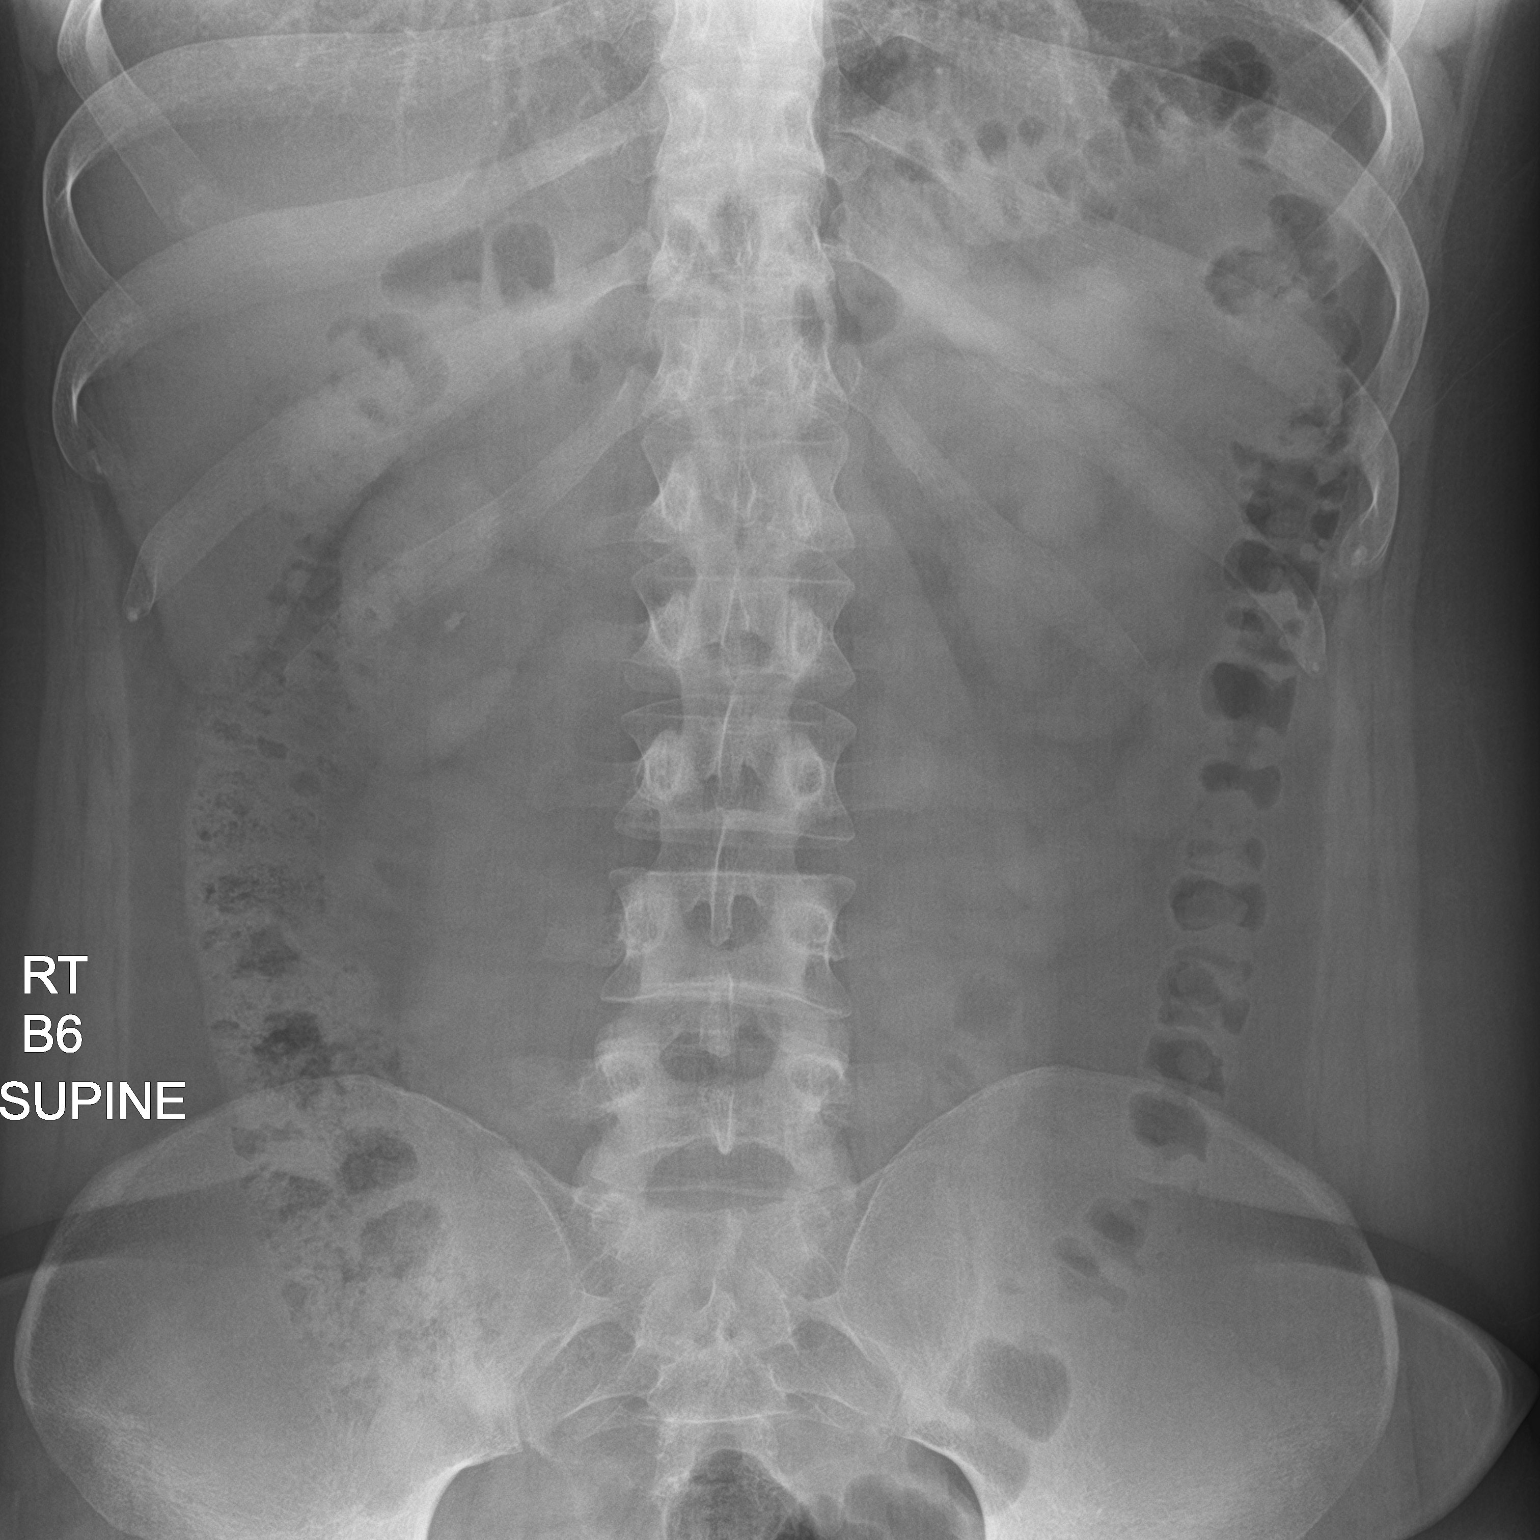
[im 2/2]
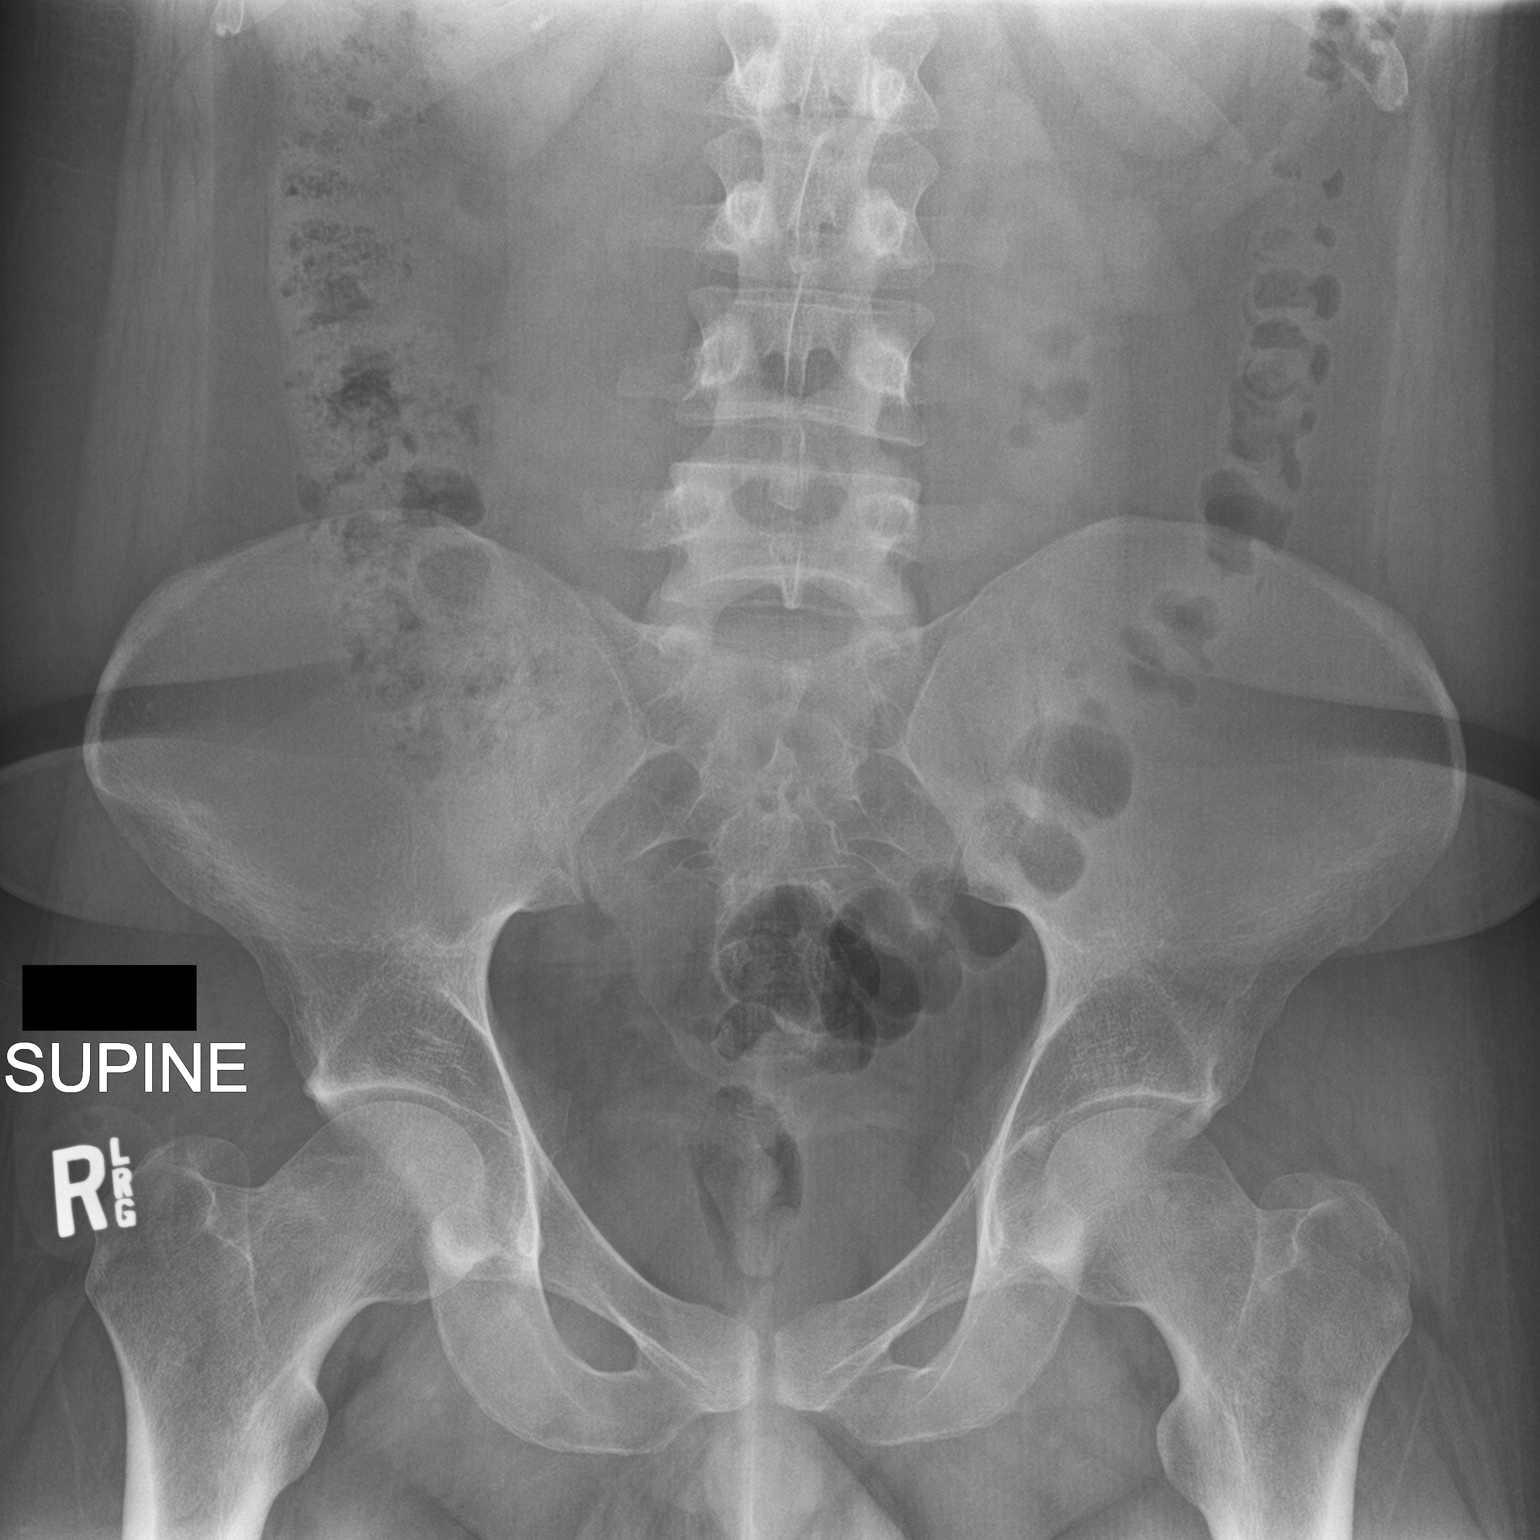

[2 of 2 positions shown; findings below may reference images not displayed]

FINDINGS: 6 mm right renal calculus over the lower pole, stable from CT
earlier today. Negative for ureteral calculus. Normal bowel gas
pattern.
IMPRESSION: 6 mm right renal calculus.

## 2020-08-20 IMAGING — CT CT RENAL STONE PROTOCOL
2 of 4 series · 15 of 46 positions shown, 17 images · non-contrast
Comparison: CT the abdomen and pelvis 01/22/2016.

CLINICAL DATA: 27-year-old male with history of urinary tract
calculi. Gross hematuria last week. Right lower quadrant and
right-sided flank pain for the past 3 weeks.

EXAM:
CT ABDOMEN AND PELVIS WITHOUT CONTRAST
TECHNIQUE: Multidetector CT imaging of the abdomen and pelvis was performed
following the standard protocol without IV contrast.

[Series 2: renal stone · axial · 0.75mm/px · z∈[-1660,-1195]mm · 12 of 107 slices shown, 14 images (1 of 2)]
[im 9/107  soft-tissue]
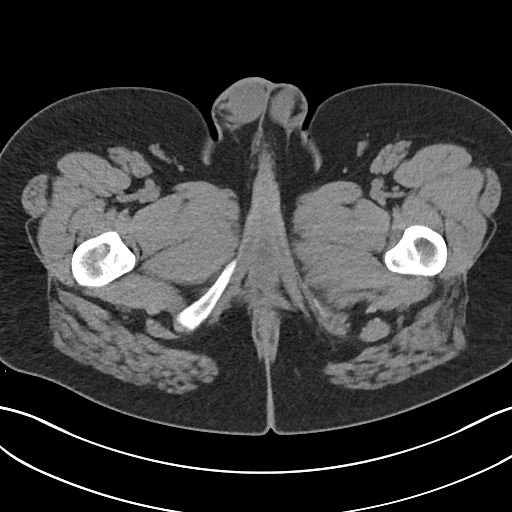
[im 9/107  bone]
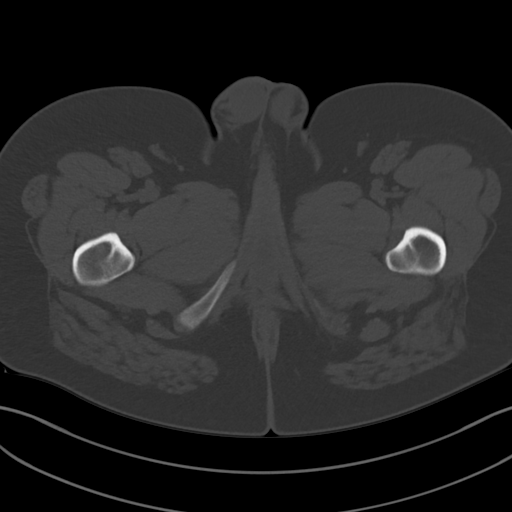
[im 17/107  soft-tissue]
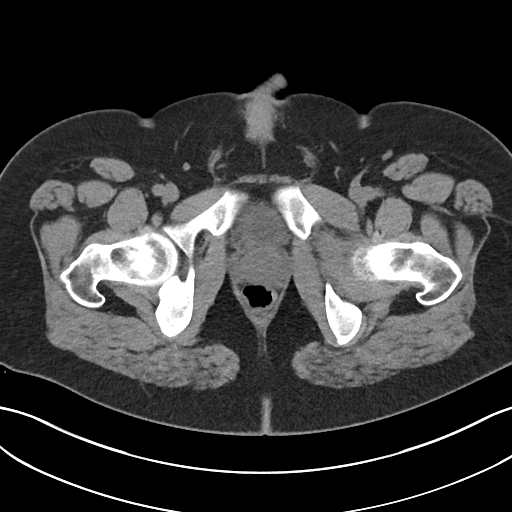
[im 26/107  soft-tissue]
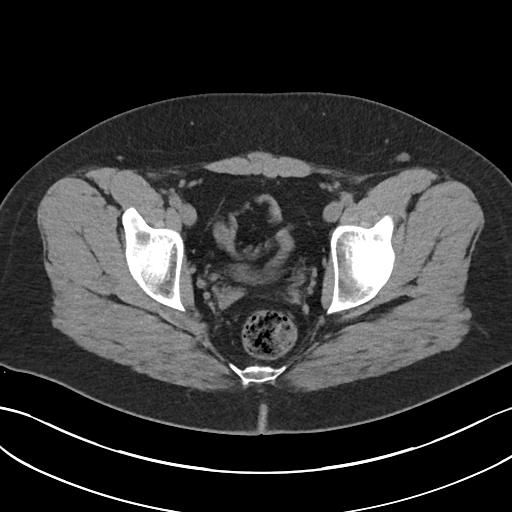
[im 34/107  soft-tissue]
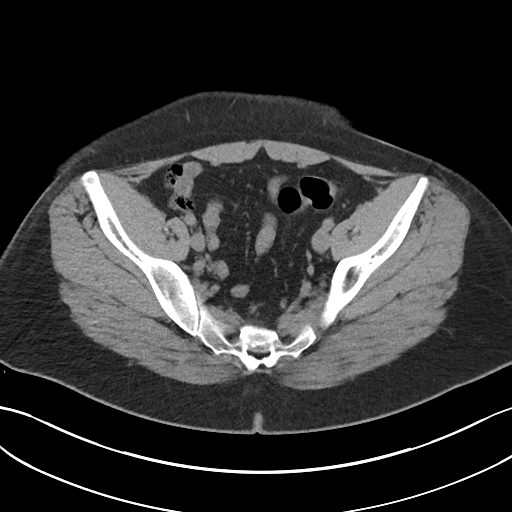
[im 43/107  soft-tissue]
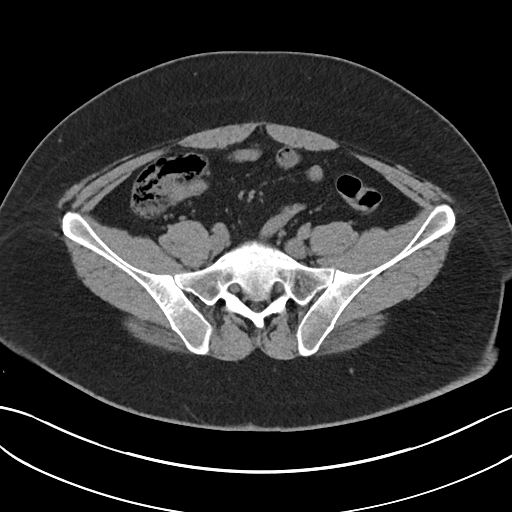
[im 51/107  soft-tissue]
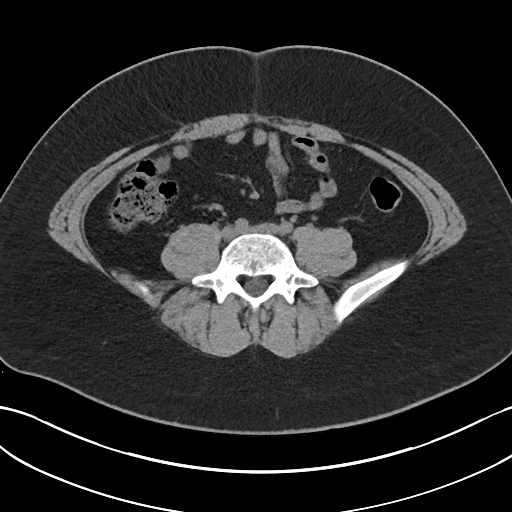
[im 60/107  soft-tissue]
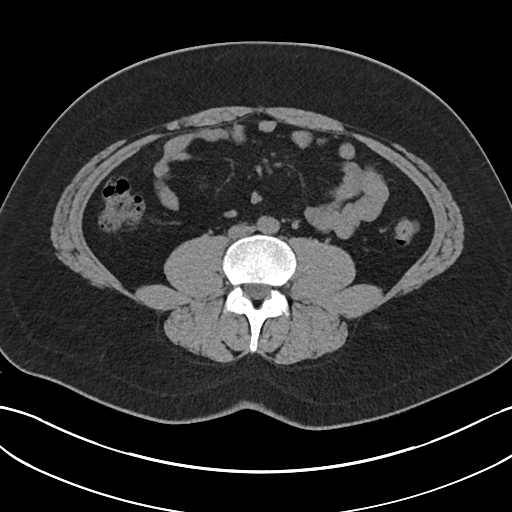
[im 68/107  soft-tissue]
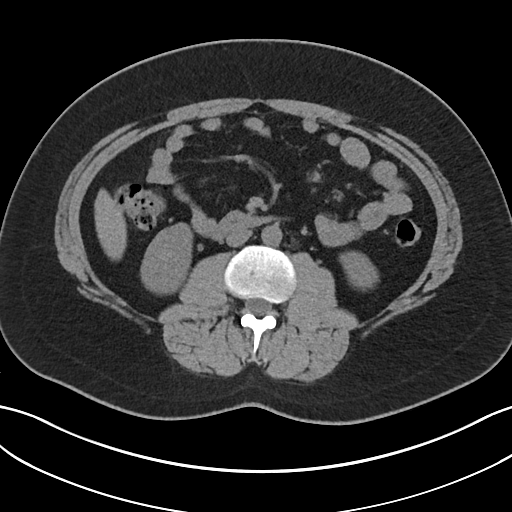
[im 77/107  soft-tissue]
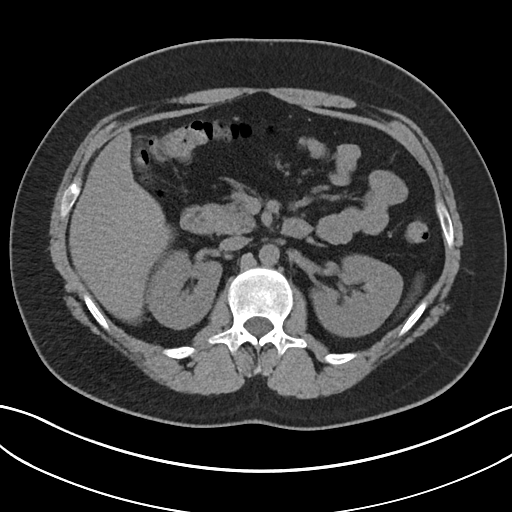
[im 77/107  bone]
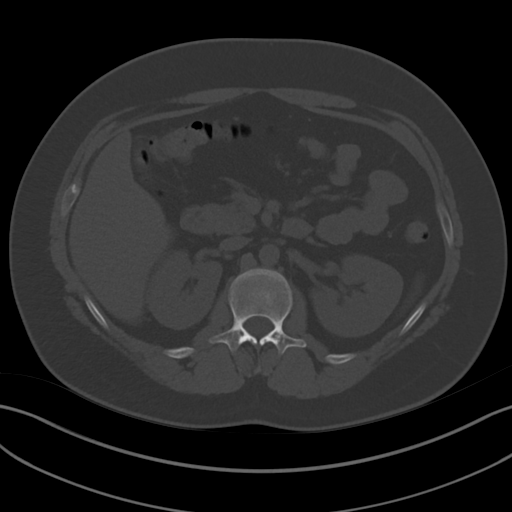
[im 85/107  soft-tissue]
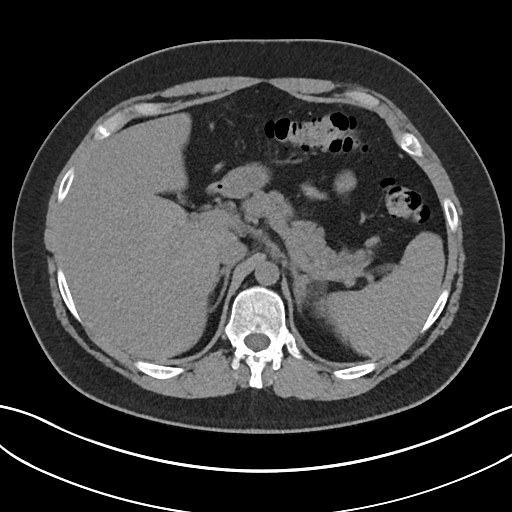
[im 94/107  soft-tissue]
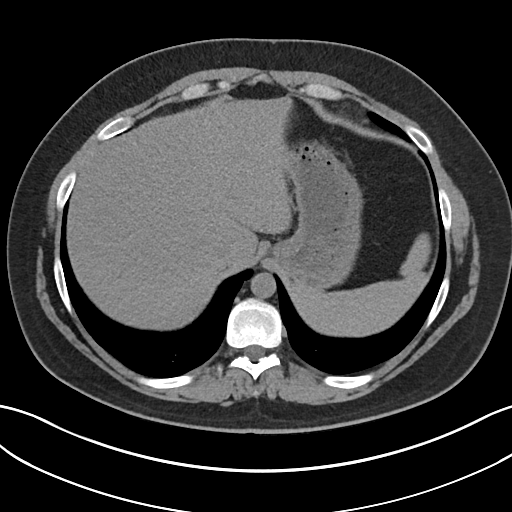
[im 102/107  soft-tissue]
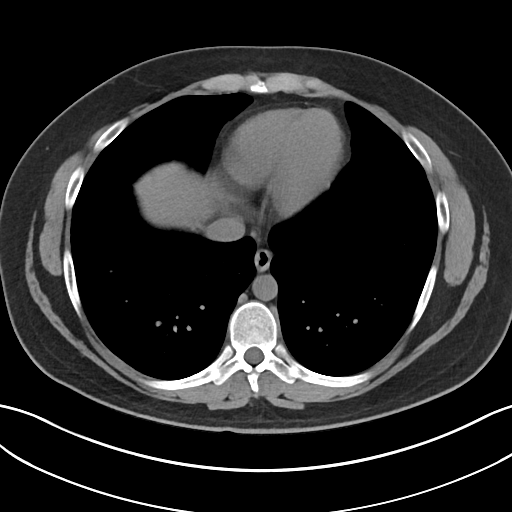

[Series 4: renal stone · coronal · 0.75mm/px · 3 of 157 slices shown (2 of 2)]
[im 53/157  soft-tissue]
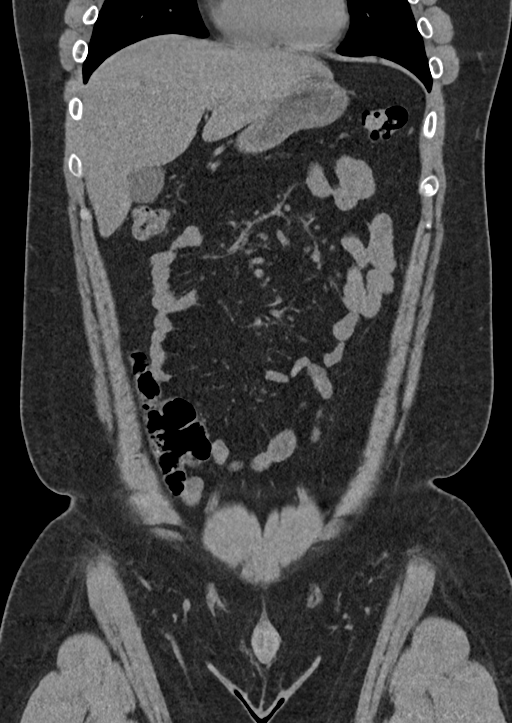
[im 70/157  soft-tissue]
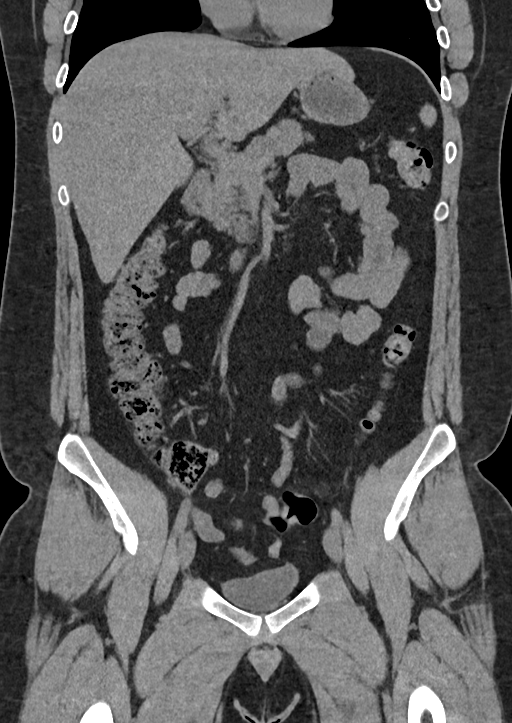
[im 87/157  soft-tissue]
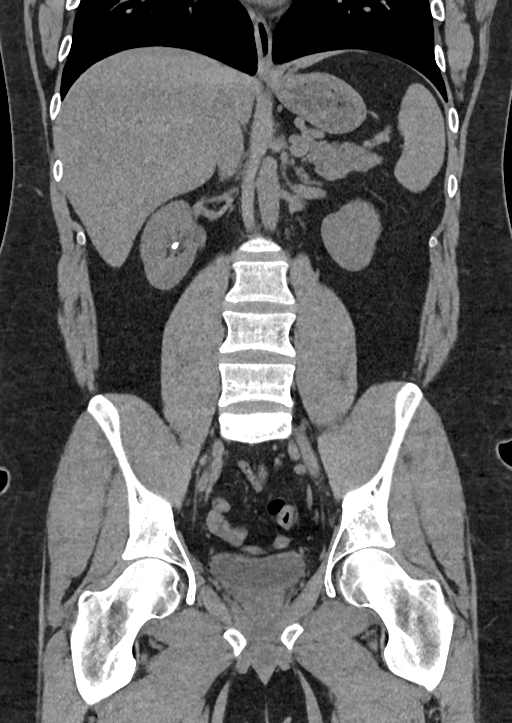

[15 of 46 positions shown; findings below may reference images not displayed]

FINDINGS: Lower chest: Unremarkable.

Hepatobiliary: No definite suspicious appearing pulmonary nodules or
masses are noted on today's noncontrast CT examination. Unenhanced
appearance of the gallbladder is normal.

Pancreas: No definite pancreatic mass or peripancreatic fluid or
inflammatory changes are noted on today's noncontrast CT
examination.

Spleen: Calcified granulomas throughout the spleen.

Adrenals/Urinary Tract: 6 mm nonobstructive calculus in the lower
pole collecting system of the right kidney. No additional calculi
are noted within the collecting system of left kidney, along the
course of either ureter, or within the lumen of the urinary bladder.
No hydroureteronephrosis. Unenhanced appearance of the kidneys and
bilateral adrenal glands is otherwise normal. Urinary bladder is
normal in appearance.

Stomach/Bowel: Unenhanced appearance of the stomach is normal. No
pathologic dilatation of small bowel or colon. Normal appendix.

Vascular/Lymphatic: No atherosclerotic calcifications are noted in
the abdominal aorta or pelvic vasculature. No lymphadenopathy noted
in the abdomen or pelvis.

Reproductive: Prostate gland and seminal vesicles are unremarkable
in appearance.

Other: No significant volume of ascites.  No pneumoperitoneum.

Musculoskeletal: 8 mm sclerotic lesion with narrow zone of
transition in the left ilium adjacent to the left acetabulum, stable
compared to the prior study, compatible with a small bone island.
There are no aggressive appearing lytic or blastic lesions noted in
the visualized portions of the skeleton.
IMPRESSION: 1. 6 mm nonobstructive calculus in the lower pole collecting system
of the right kidney. No ureteral stones or findings of urinary tract
obstruction are noted at this time.
